# Patient Record
Sex: Female | Born: 2000 | Race: Black or African American | Hispanic: No | Marital: Single
Health system: Southern US, Community
[De-identification: ages and names within clinical notes are randomized; demographics above are authoritative.]

## PROBLEM LIST (undated history)

## (undated) DIAGNOSIS — Z8489 Family history of other specified conditions: Secondary | ICD-10-CM

## (undated) DIAGNOSIS — R198 Other specified symptoms and signs involving the digestive system and abdomen: Secondary | ICD-10-CM

## (undated) DIAGNOSIS — Z8709 Personal history of other diseases of the respiratory system: Secondary | ICD-10-CM

## (undated) DIAGNOSIS — R1310 Dysphagia, unspecified: Secondary | ICD-10-CM

## (undated) DIAGNOSIS — J45909 Unspecified asthma, uncomplicated: Secondary | ICD-10-CM

## (undated) DIAGNOSIS — J343 Hypertrophy of nasal turbinates: Secondary | ICD-10-CM

## (undated) DIAGNOSIS — J353 Hypertrophy of tonsils with hypertrophy of adenoids: Secondary | ICD-10-CM

## (undated) DIAGNOSIS — J3501 Chronic tonsillitis: Secondary | ICD-10-CM

## (undated) HISTORY — PX: TONSILLECTOMY: SUR1361

## (undated) HISTORY — DX: Unspecified asthma, uncomplicated: J45.909

## (undated) HISTORY — PX: ADENOIDECTOMY: SUR15

---
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
## 2019-01-13 ENCOUNTER — Telehealth: Payer: 59

## 2019-01-30 ENCOUNTER — Encounter: Payer: 59 | Admitting: Advanced Practice Midwife

## 2019-11-13 ENCOUNTER — Emergency Department (HOSPITAL_COMMUNITY)
Admission: EM | Admit: 2019-11-13 | Discharge: 2019-11-14 | Disposition: A | Discharge status: Home / Self Care | Payer: 59 | Attending: Emergency Medicine | Admitting: Emergency Medicine

## 2019-11-13 ENCOUNTER — Emergency Department (HOSPITAL_COMMUNITY): Payer: 59

## 2019-11-13 ENCOUNTER — Encounter (HOSPITAL_COMMUNITY): Payer: Self-pay | Admitting: Emergency Medicine

## 2019-11-13 DIAGNOSIS — M25561 Pain in right knee: Secondary | ICD-10-CM | POA: Insufficient documentation

## 2019-11-13 DIAGNOSIS — Z91010 Allergy to peanuts: Secondary | ICD-10-CM | POA: Insufficient documentation

## 2019-11-13 DIAGNOSIS — J45909 Unspecified asthma, uncomplicated: Secondary | ICD-10-CM | POA: Insufficient documentation

## 2019-11-13 DIAGNOSIS — Z7722 Contact with and (suspected) exposure to environmental tobacco smoke (acute) (chronic): Secondary | ICD-10-CM | POA: Diagnosis not present

## 2019-11-13 DIAGNOSIS — Y92410 Unspecified street and highway as the place of occurrence of the external cause: Secondary | ICD-10-CM | POA: Insufficient documentation

## 2019-11-13 DIAGNOSIS — R079 Chest pain, unspecified: Secondary | ICD-10-CM | POA: Insufficient documentation

## 2019-11-13 DIAGNOSIS — Y9389 Activity, other specified: Secondary | ICD-10-CM | POA: Diagnosis not present

## 2019-11-13 DIAGNOSIS — M545 Low back pain, unspecified: Secondary | ICD-10-CM | POA: Diagnosis not present

## 2019-11-13 DIAGNOSIS — R52 Pain, unspecified: Secondary | ICD-10-CM

## 2019-11-13 DIAGNOSIS — R519 Headache, unspecified: Secondary | ICD-10-CM | POA: Diagnosis present

## 2019-11-13 MED ORDER — ACETAMINOPHEN 325 MG PO TABS
650.0000 mg | ORAL_TABLET | Freq: Once | ORAL | Status: AC
  Administered 2019-11-13: 650 mg via ORAL
  Filled 2019-11-13: qty 2
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
## 2019-11-14 ENCOUNTER — Emergency Department (HOSPITAL_COMMUNITY): Payer: 59

## 2019-11-14 LAB — POC URINE PREG, ED: Preg Test, Ur: NEGATIVE

## 2019-11-14 MED ORDER — OXYCODONE-ACETAMINOPHEN 5-325 MG PO TABS: 1.0000 | ORAL_TABLET | ORAL | 0 refills | Status: DC | PRN

## 2019-11-14 MED ORDER — METHOCARBAMOL 500 MG PO TABS: 500.0000 mg | ORAL_TABLET | Freq: Two times a day (BID) | ORAL | 0 refills | Status: DC

## 2019-11-14 MED ORDER — OXYCODONE-ACETAMINOPHEN 5-325 MG PO TABS
1.0000 | ORAL_TABLET | Freq: Once | ORAL | Status: AC
  Administered 2019-11-14: 1 via ORAL
  Filled 2019-11-14: qty 1

## 2019-11-14 MED ORDER — ONDANSETRON 4 MG PO TBDP
4.0000 mg | ORAL_TABLET | Freq: Once | ORAL | Status: AC
  Administered 2019-11-14: 4 mg via ORAL
  Filled 2019-11-14: qty 1

## 2019-11-14 NOTE — Discharge Instructions (Signed)
Imaging today negative for any acute fractures or internal injuries. You will likely be sore for a few days which is normal. Take the prescribed medication as directed.  Do not drive if taking pain medication. Follow-up with your primary care doctor. Return to the ED for new or worsening symptoms.
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
## 2021-07-11 ENCOUNTER — Encounter (HOSPITAL_BASED_OUTPATIENT_CLINIC_OR_DEPARTMENT_OTHER): Payer: Self-pay

## 2021-07-11 ENCOUNTER — Other Ambulatory Visit: Payer: Self-pay

## 2021-07-11 ENCOUNTER — Emergency Department (HOSPITAL_BASED_OUTPATIENT_CLINIC_OR_DEPARTMENT_OTHER)
Admission: EM | Admit: 2021-07-11 | Discharge: 2021-07-12 | Disposition: A | Discharge status: Home / Self Care | Payer: Self-pay | Attending: Emergency Medicine | Admitting: Emergency Medicine

## 2021-07-11 ENCOUNTER — Emergency Department (HOSPITAL_BASED_OUTPATIENT_CLINIC_OR_DEPARTMENT_OTHER): Payer: 59

## 2021-07-11 DIAGNOSIS — Y92410 Unspecified street and highway as the place of occurrence of the external cause: Secondary | ICD-10-CM | POA: Insufficient documentation

## 2021-07-11 DIAGNOSIS — S199XXA Unspecified injury of neck, initial encounter: Secondary | ICD-10-CM | POA: Diagnosis not present

## 2021-07-11 DIAGNOSIS — S0990XA Unspecified injury of head, initial encounter: Secondary | ICD-10-CM | POA: Diagnosis present

## 2021-07-11 DIAGNOSIS — S161XXA Strain of muscle, fascia and tendon at neck level, initial encounter: Secondary | ICD-10-CM

## 2021-07-11 DIAGNOSIS — T148XXA Other injury of unspecified body region, initial encounter: Secondary | ICD-10-CM
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
## 2021-07-11 NOTE — ED Notes (Signed)
C/o bilateral knee pain, c-collar applied

## 2021-07-12 ENCOUNTER — Encounter (HOSPITAL_BASED_OUTPATIENT_CLINIC_OR_DEPARTMENT_OTHER): Payer: Self-pay | Admitting: Emergency Medicine

## 2021-07-12 MED ORDER — IBUPROFEN 800 MG PO TABS
800.0000 mg | ORAL_TABLET | Freq: Once | ORAL | Status: AC
  Administered 2021-07-12: 800 mg via ORAL
  Filled 2021-07-12: qty 1

## 2021-07-12 MED ORDER — IBUPROFEN 800 MG PO TABS: 800.0000 mg | ORAL_TABLET | Freq: Three times a day (TID) | ORAL | 0 refills | Status: DC

## 2021-07-12 MED ORDER — ACETAMINOPHEN 500 MG PO TABS
1000.0000 mg | ORAL_TABLET | Freq: Once | ORAL | Status: AC
  Administered 2021-07-12: 1000 mg via ORAL
  Filled 2021-07-12: qty 2

## 2021-07-12 NOTE — ED Notes (Signed)
Pt verbalizes understanding of discharge instructions. Opportunity for questioning and answers were provided. Pt discharged from ED to home with family.    
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
## 2022-12-18 ENCOUNTER — Emergency Department (HOSPITAL_COMMUNITY)
Admission: EM | Admit: 2022-12-18 | Discharge: 2022-12-18 | Disposition: A | Discharge status: Home / Self Care | Payer: No Typology Code available for payment source | Attending: Emergency Medicine | Admitting: Emergency Medicine

## 2022-12-18 ENCOUNTER — Encounter (HOSPITAL_COMMUNITY): Payer: Self-pay | Admitting: Emergency Medicine

## 2022-12-18 ENCOUNTER — Other Ambulatory Visit: Payer: Self-pay

## 2022-12-18 DIAGNOSIS — L7622 Postprocedural hemorrhage and hematoma of skin and subcutaneous tissue following other procedure: Secondary | ICD-10-CM | POA: Diagnosis not present

## 2022-12-18 DIAGNOSIS — M79602 Pain in left arm: Secondary | ICD-10-CM | POA: Diagnosis present

## 2022-12-18 DIAGNOSIS — L7632 Postprocedural hematoma of skin and subcutaneous tissue following other procedure: Secondary | ICD-10-CM

## 2022-12-18 MED ORDER — IBUPROFEN 800 MG PO TABS: 800.0000 mg | ORAL_TABLET | Freq: Four times a day (QID) | ORAL | 0 refills | Status: AC | PRN

## 2022-12-18 MED ORDER — DOXYCYCLINE HYCLATE 100 MG PO CAPS: 100.0000 mg | ORAL_CAPSULE | Freq: Two times a day (BID) | ORAL | 0 refills | Status: AC

## 2022-12-18 NOTE — Discharge Instructions (Addendum)
Rest and elevate the arm.  Apply warm compresses on and off for the next couple of days.
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
## 2022-12-18 NOTE — ED Provider Notes (Signed)
[facility] Provider Note   CSN: [id number] Arrival date & time: 12/18/22  0146     History  Chief Complaint  Patient presents with   Arm Pain    Maria Hicks is a 22 y.o. female.  Patient presents with complaints of pain in the left arm.  Patient reports that she attempted to give blood 2 days ago.  She reports that they are unable to cannulate the vein, did not take any blood.  Patient reports pain, swelling at the site where they attempted to draw blood from.       Home Medications Prior to Admission medications   Medication Sig Start Date End Date Taking? Authorizing Provider  doxycycline (VIBRAMYCIN) 100 MG capsule Take 1 capsule (100 mg total) by mouth 2 (two) times daily. 12/18/22  Yes Athanasius Kesling, Canary Brim, MD  ibuprofen (ADVIL) 800 MG tablet Take 1 tablet (800 mg total) by mouth every 6 (six) hours as needed for moderate pain (pain score 4-6). 12/18/22  Yes Laquanta Hummel, Canary Brim, MD      Allergies    Peanut-containing drug products and Penicillins    Review of Systems   Review of Systems  Physical Exam Updated Vital Signs BP 92/76   Pulse (!) 58   Temp (!) 97.4 F (36.3 C) (Oral)   Resp 18   Ht 5\' 4"  (1.626 m)   Wt 55.8 kg   SpO2 99%   BMI 21.11 kg/m  Physical Exam Vitals and nursing note reviewed.  Constitutional:      Appearance: Normal appearance.  HENT:     Head: Atraumatic.  Cardiovascular:     Pulses:          Radial pulses are 2+ on the left side.  Musculoskeletal:     Left elbow: Swelling (Antecubital fossa region) present. Normal range of motion. Tenderness (Antecubital fossa) present.  Skin:    Findings: Bruising (Antecubital fossa left arm) present.  Neurological:     Mental Status: She is alert.     Sensory: Sensation is intact.     Motor: Motor function is intact.     ED Results / Procedures / Treatments   Labs (all labs ordered are listed, but only abnormal results are  displayed) Labs Reviewed - No data to display  EKG None  Radiology No results found.  Procedures Procedures    Medications Ordered in ED Medications - No data to display  ED Course/ Medical Decision Making/ A&P                                 Medical Decision Making  Presents with complaints of pain and swelling of the left arm after attempting to give blood 2 days ago.  Examination reveals mild soft tissue swelling, bruising at the site of venipuncture attempt.  Patient reports that the technician attempted to draw blood but was unable to and the donation attempt was stopped.  Examination consistent with hematoma formation.  No palpable phlebitis.  No overlying erythema, redness, warmth or abscess.  Upper arm examination normal, distal pulses and neurologic exam normal.  Presentation not concerning for DVT.  Treat with NSAIDs, warm compresses, elevation.  Given work note.  Will give empiric 7 days of Doxy.        Final Clinical Impression(s) / ED Diagnoses Final diagnoses:  Postprocedural hematoma of skin and subcutaneous tissue following other procedure  Rx / DC Orders ED Discharge Orders          Ordered    ibuprofen (ADVIL) 800 MG tablet  Every 6 hours PRN        12/18/22 0600    doxycycline (VIBRAMYCIN) 100 MG capsule  2 times daily        12/18/22 0600              Allex Madia, Canary Brim, MD 12/18/22 0600
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
## 2023-01-24 ENCOUNTER — Emergency Department (HOSPITAL_BASED_OUTPATIENT_CLINIC_OR_DEPARTMENT_OTHER)
Admission: EM | Admit: 2023-01-24 | Discharge: 2023-01-24 | Disposition: A | Discharge status: Home / Self Care | Payer: No Typology Code available for payment source | Attending: Emergency Medicine | Admitting: Emergency Medicine

## 2023-01-24 ENCOUNTER — Other Ambulatory Visit: Payer: Self-pay

## 2023-01-24 ENCOUNTER — Encounter (HOSPITAL_BASED_OUTPATIENT_CLINIC_OR_DEPARTMENT_OTHER): Payer: Self-pay | Admitting: Emergency Medicine

## 2023-01-24 DIAGNOSIS — J101 Influenza due to other identified influenza virus with other respiratory manifestations: Secondary | ICD-10-CM | POA: Insufficient documentation

## 2023-01-24 DIAGNOSIS — Z7951 Long term (current) use of inhaled steroids: Secondary | ICD-10-CM | POA: Diagnosis not present

## 2023-01-24 DIAGNOSIS — R112 Nausea with vomiting, unspecified: Secondary | ICD-10-CM | POA: Diagnosis present

## 2023-01-24 DIAGNOSIS — Z91010 Allergy to peanuts: Secondary | ICD-10-CM | POA: Insufficient documentation

## 2023-01-24 DIAGNOSIS — J45909 Unspecified asthma, uncomplicated: Secondary | ICD-10-CM | POA: Diagnosis not present

## 2023-01-24 MED ORDER — ALBUTEROL SULFATE HFA 108 (90 BASE) MCG/ACT IN AERS
1.0000 | INHALATION_SPRAY | RESPIRATORY_TRACT | Status: DC
  Administered 2023-01-24: 2 via RESPIRATORY_TRACT
  Filled 2023-01-24: qty 6.7

## 2023-01-24 MED ORDER — ONDANSETRON 4 MG PO TBDP: 4.0000 mg | ORAL_TABLET | Freq: Three times a day (TID) | ORAL | 0 refills | Status: AC | PRN

## 2023-01-24 MED ORDER — OSELTAMIVIR PHOSPHATE 75 MG PO CAPS: 75.0000 mg | ORAL_CAPSULE | Freq: Two times a day (BID) | ORAL | 0 refills | Status: AC

## 2023-01-24 MED ORDER — AEROCHAMBER PLUS FLO-VU SMALL MISC
1.0000 | Freq: Once | Status: AC
  Administered 2023-01-24: 1
  Filled 2023-01-24: qty 1

## 2023-01-24 MED ORDER — ONDANSETRON 4 MG PO TBDP
4.0000 mg | ORAL_TABLET | Freq: Once | ORAL | Status: AC
  Administered 2023-01-24: 4 mg via ORAL
  Filled 2023-01-24: qty 1

## 2023-01-24 NOTE — ED Notes (Signed)
RT educated pt on proper use of MDI w/spacer. Pt able to perform w/out difficulty. Pt verbalizes understanding of teaching. BLBS clear throughout all lung fields.

## 2023-01-24 NOTE — ED Triage Notes (Signed)
Tested positive for RSV and Flu on Sunday. States she is unable to keep anything down and having severe body aches since.

## 2023-01-24 NOTE — Discharge Instructions (Addendum)
Thank you for letting us evaluate you today.  I have sent Tamiflu and Zofran to your pharmacy.  Tamiflu may cause some GI upset and it is expected to decrease symptoms by one day.  Continue to take DayQuil, NyQuil, Tylenol or ibuprofen as needed for body aches. Use albuterol inhaler as needed for SOB. You can drink Pedialyte, Gatorade, broth, water (whatever you can keep down)  Return to ED if you cannot keep liquids down causing severe dehydration or significant worsening of symptoms

## 2023-01-24 NOTE — ED Provider Notes (Signed)
[place] EMERGENCY DEPARTMENT AT [facility] Provider Note   CSN: [id number] Arrival date & time: 01/24/23  1631     History  Chief Complaint  Patient presents with   Emesis    Maria Hicks is a 22 y.o. female with past medical history of childhood asthma presents for evaluation of bodyaches, nausea, vomiting, fever since tested positive for influenza A on 01/22/2023.  She reports that she occasionally vomits solid food so has been eating a liquid diet consisting of broth.  She has tried Tylenol, DayQuil, Vicks, and humidifier at home with some improvement of symptoms.   Emesis Associated symptoms: no abdominal pain, no chills, no cough, no diarrhea, no fever and no headaches      Home Medications Prior to Admission medications   Medication Sig Start Date End Date Taking? Authorizing Provider  ondansetron (ZOFRAN-ODT) 4 MG disintegrating tablet Take 1 tablet (4 mg total) by mouth every 8 (eight) hours as needed for nausea or vomiting. 01/24/23  Yes Judithann Sheen, PA  oseltamivir (TAMIFLU) 75 MG capsule Take 1 capsule (75 mg total) by mouth every 12 (twelve) hours. 01/24/23  Yes Judithann Sheen, PA  doxycycline (VIBRAMYCIN) 100 MG capsule Take 1 capsule (100 mg total) by mouth 2 (two) times daily. 12/18/22   Gilda Crease, MD  ibuprofen (ADVIL) 800 MG tablet Take 1 tablet (800 mg total) by mouth every 6 (six) hours as needed for moderate pain (pain score 4-6). 12/18/22   Gilda Crease, MD      Allergies    Peanut-containing drug products and Penicillins    Review of Systems   Review of Systems  Constitutional:  Negative for chills, fatigue and fever.  Respiratory:  Negative for cough, chest tightness, shortness of breath and wheezing.   Cardiovascular:  Negative for chest pain and palpitations.  Gastrointestinal:  Positive for vomiting. Negative for abdominal pain, constipation, diarrhea and nausea.  Neurological:  Negative for dizziness,  seizures, weakness, light-headedness, numbness and headaches.    Physical Exam Updated Vital Signs BP 119/67 (BP Location: Right Arm)   Pulse 84   Temp 98.5 F (36.9 C) (Oral)   Resp 16   Ht 5\' 4"  (1.626 m)   Wt 55.8 kg   SpO2 97%   BMI 21.11 kg/m  Physical Exam Vitals and nursing note reviewed.  Constitutional:      General: She is not in acute distress.    Appearance: Normal appearance. She is not ill-appearing.  HENT:     Head: Normocephalic and atraumatic.     Right Ear: Tympanic membrane, ear canal and external ear normal.     Left Ear: Tympanic membrane, ear canal and external ear normal.     Nose: Nose normal.     Right Sinus: No maxillary sinus tenderness or frontal sinus tenderness.     Left Sinus: No maxillary sinus tenderness or frontal sinus tenderness.     Mouth/Throat:     Mouth: Mucous membranes are moist.     Pharynx: Uvula midline. No oropharyngeal exudate, posterior oropharyngeal erythema or uvula swelling.     Tonsils: No tonsillar exudate or tonsillar abscesses. 1+ on the right. 1+ on the left.  Eyes:     Conjunctiva/sclera: Conjunctivae normal.  Cardiovascular:     Rate and Rhythm: Normal rate.  Pulmonary:     Effort: Pulmonary effort is normal. No respiratory distress.     Breath sounds: Normal breath sounds.  Chest:     Chest wall:  No tenderness.  Abdominal:     General: Bowel sounds are normal. There is no distension.     Palpations: Abdomen is soft.     Tenderness: There is no abdominal tenderness. There is no guarding or rebound.  Musculoskeletal:     Cervical back: Normal range of motion and neck supple. No rigidity or tenderness.  Lymphadenopathy:     Cervical: No cervical adenopathy.  Skin:    Coloration: Skin is not jaundiced or pale.  Neurological:     Mental Status: She is alert and oriented to person, place, and time. Mental status is at baseline.     ED Results / Procedures / Treatments   Labs (all labs ordered are listed, but  only abnormal results are displayed) Labs Reviewed - No data to display  EKG None  Radiology No results found.  Procedures Procedures    Medications Ordered in ED Medications  albuterol (VENTOLIN HFA) 108 (90 Base) MCG/ACT inhaler 1-2 puff (2 puffs Inhalation Given 01/24/23 1942)  ondansetron (ZOFRAN-ODT) disintegrating tablet 4 mg (4 mg Oral Given 01/24/23 1901)  AeroChamber Plus Flo-Vu Small device MISC 1 each (1 each Other Given 01/24/23 1952)    ED Course/ Medical Decision Making/ A&P                                 Medical Decision Making Risk Prescription drug management.   Patient presents to the ED for concern of bodyaches following being tested positive for influenza, this involves an extensive number of treatment options, and is a complaint that carries with it a high risk of complications and morbidity.    Co morbidities that complicate the patient evaluation  Past medical history of childhood asthma   Additional history obtained:  Additional history obtained from [facility], Nursing, and Outside Medical Records   External records from outside source obtained and reviewed including  RN note Information from family Urgent care visit visit from 01/22/2023 where she tested positive for influenza A     Medicines ordered and prescription drug management:  I ordered medication including Zofran and inhaler for nausea and occasional shortness of breath with vomiting. Tamiflu for flu Reevaluation of the patient after these medicines showed that the patient improved I have reviewed the patients home medicines and have made adjustments as needed    Problem List / ED Course:  Influenza A Patient is resting comfortably in bed.  She is not ill-appearing.  She does have a reassuring physical exam with lung sounds CTAB As she reports that she does have occasional shortness of breath when she is vomiting and with a history of childhood asthma, I provided her with a  nebulizer to use as needed for wheezing or shortness of breath.  However, she denies wheezing.  Will also provide Zofran  Discussed return to emergency department precautions with patient and patient's family who expresses understanding agrees with plan.  All questions answered to their satisfaction.  They are agreeable with discharge at this time.   Reevaluation:  After the interventions noted above, I reevaluated the patient and found that they have :improved   Social Determinants of Health:  Has pediatrician follow-up   Dispostion:  After consideration of the diagnostic results and the patients response to treatment, I feel that the patent would benefit from outpatient management and OTC medication.   Final Clinical Impression(s) / ED Diagnoses Final diagnoses:  Influenza A    Rx /  DC Orders ED Discharge Orders          Ordered    oseltamivir (TAMIFLU) 75 MG capsule  Every 12 hours        01/24/23 1917    ondansetron (ZOFRAN-ODT) 4 MG disintegrating tablet  Every 8 hours PRN        01/24/23 1917              Judithann Sheen, PA 01/24/23 2315    Derwood Kaplan, MD 01/25/23 [phone]

## 2023-10-16 ENCOUNTER — Other Ambulatory Visit: Payer: Self-pay

## 2023-10-16 DIAGNOSIS — Y92410 Unspecified street and highway as the place of occurrence of the external cause: Secondary | ICD-10-CM | POA: Diagnosis not present

## 2023-10-16 DIAGNOSIS — M25561 Pain in right knee: Secondary | ICD-10-CM | POA: Insufficient documentation

## 2023-10-16 DIAGNOSIS — Z91010 Allergy to peanuts: Secondary | ICD-10-CM | POA: Diagnosis not present

## 2023-10-16 DIAGNOSIS — M545 Low back pain, unspecified: Secondary | ICD-10-CM | POA: Diagnosis present
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
## 2023-10-17 ENCOUNTER — Emergency Department (HOSPITAL_BASED_OUTPATIENT_CLINIC_OR_DEPARTMENT_OTHER)
Admission: EM | Admit: 2023-10-17 | Discharge: 2023-10-17 | Disposition: A | Discharge status: Home / Self Care | Attending: Emergency Medicine | Admitting: Emergency Medicine

## 2023-10-17 ENCOUNTER — Emergency Department (HOSPITAL_BASED_OUTPATIENT_CLINIC_OR_DEPARTMENT_OTHER): Admitting: Radiology

## 2023-10-17 DIAGNOSIS — S39012A Strain of muscle, fascia and tendon of lower back, initial encounter: Secondary | ICD-10-CM

## 2023-10-17 DIAGNOSIS — S8001XA Contusion of right knee, initial encounter: Secondary | ICD-10-CM

## 2023-10-17 LAB — PREGNANCY, URINE: Preg Test, Ur: NEGATIVE
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
## 2023-10-17 NOTE — Discharge Instructions (Signed)
 Begin taking ibuprofen  600 mg every 8 hours as needed for pain.  Ice for 20 minutes every 2 hours while awake for the next 2 days.  Rest.  Follow-up with primary doctor if symptoms are not improving in the next week.
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
